# Patient Record
Sex: Male | Born: 1965 | Hispanic: Yes | Marital: Married
Health system: Southern US, Community
[De-identification: ages and names within clinical notes are randomized; demographics above are authoritative.]

---

## 2004-02-14 ENCOUNTER — Encounter: Admission: RE | Admit: 2004-02-14 | Discharge: 2004-02-14 | Payer: Self-pay | Admitting: Specialist

## 2005-09-20 IMAGING — CR DG CHEST 1V
1 series · 1 of 1 positions shown · non-contrast
Comparison: none

CLINICAL DATA: Positive PPD.  Smoking history.
   CHEST - ONE VIEW:
 The heart size and mediastinal contours are within normal limits. The lungs are clear.

[view not recorded]
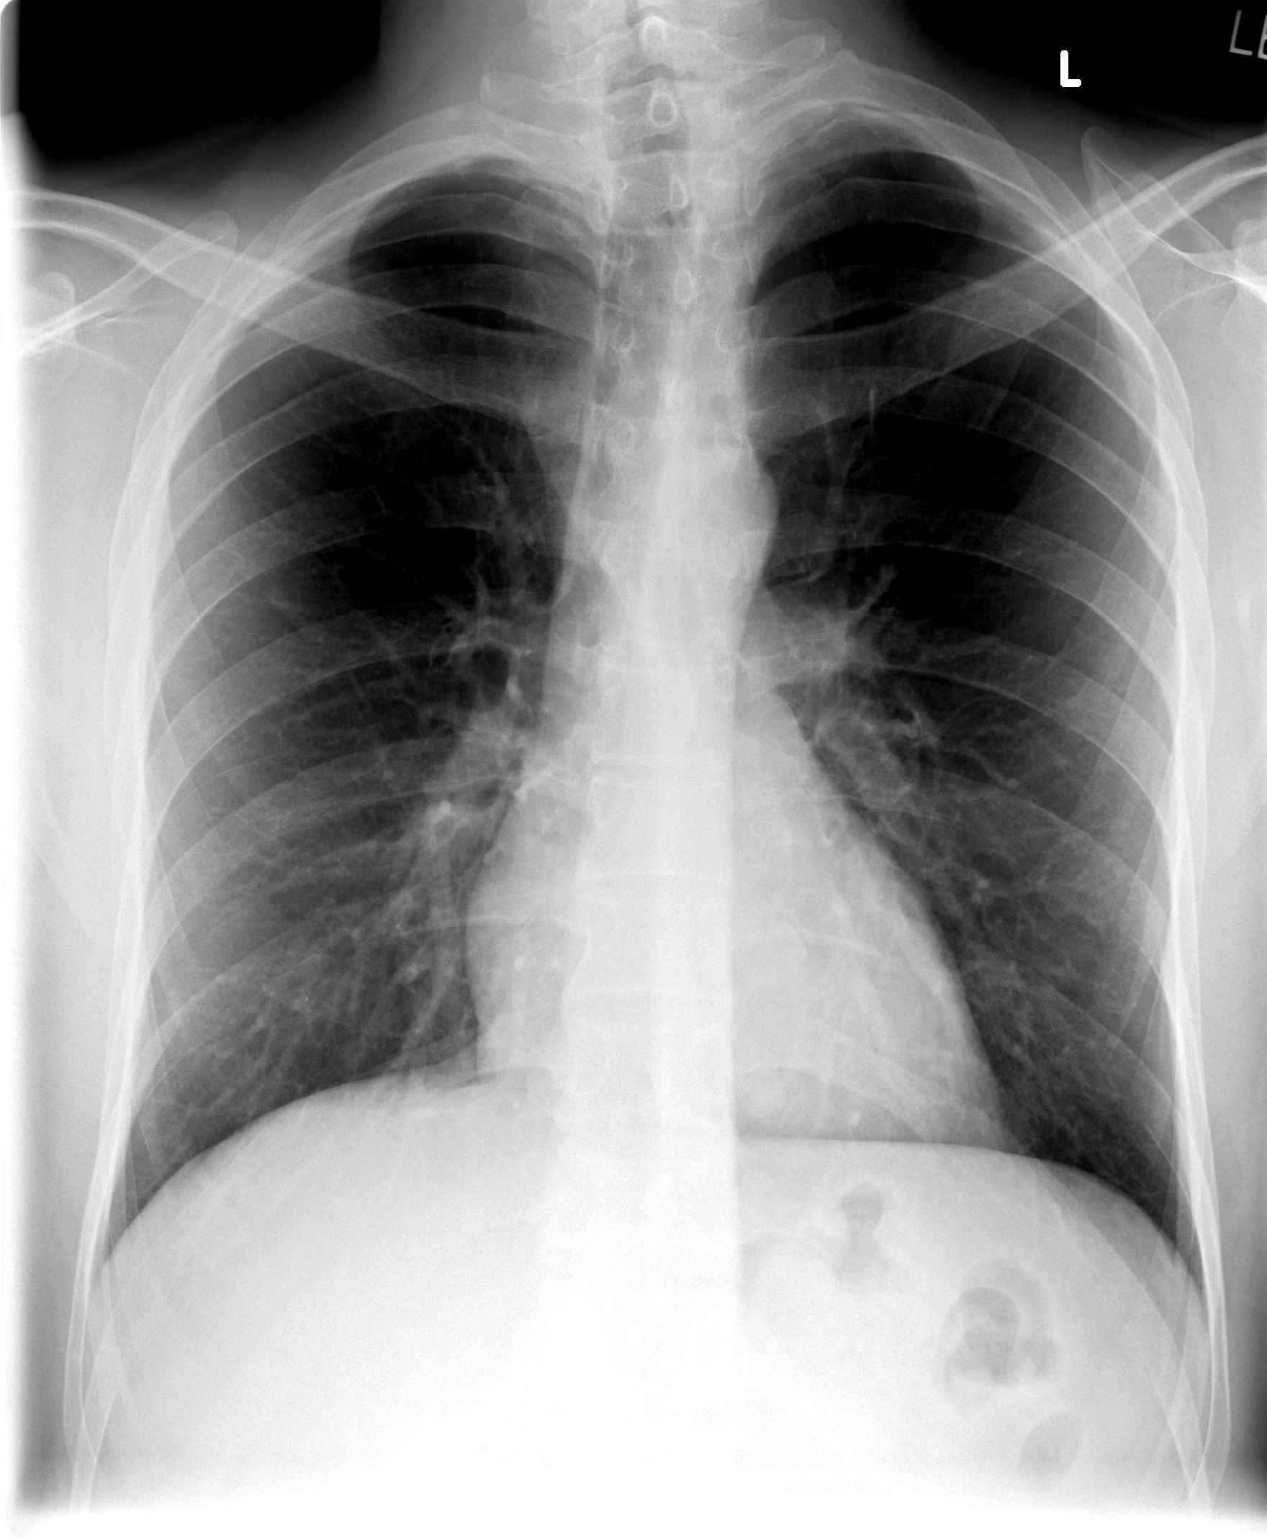

[1 of 1 positions shown; findings below may reference images not displayed]

IMPRESSION: No active cardiopulmonary disease.

## 2007-01-20 ENCOUNTER — Ambulatory Visit: Payer: Self-pay | Admitting: Family Medicine

## 2007-01-21 ENCOUNTER — Telehealth (INDEPENDENT_AMBULATORY_CARE_PROVIDER_SITE_OTHER): Payer: Self-pay | Admitting: *Deleted

## 2007-01-21 ENCOUNTER — Encounter (INDEPENDENT_AMBULATORY_CARE_PROVIDER_SITE_OTHER): Payer: Self-pay | Admitting: Family Medicine

## 2007-01-21 LAB — CONVERTED CEMR LAB
Basophils Absolute: 0 10*3/uL (ref 0.0–0.1)
Basophils Relative: 0.2 % (ref 0.0–1.0)
Eosinophils Absolute: 0.2 10*3/uL (ref 0.0–0.6)
Eosinophils Relative: 2.3 % (ref 0.0–5.0)
HCT: 39.6 % (ref 39.0–52.0)
Hemoglobin: 13.7 g/dL (ref 13.0–17.0)
Lymphocytes Relative: 34.6 % (ref 12.0–46.0)
MCHC: 34.6 g/dL (ref 30.0–36.0)
MCV: 93.5 fL (ref 78.0–100.0)
Monocytes Absolute: 0.6 10*3/uL (ref 0.2–0.7)
Monocytes Relative: 7.5 % (ref 3.0–11.0)
Neutro Abs: 4.6 10*3/uL (ref 1.4–7.7)
Neutrophils Relative %: 55.4 % (ref 43.0–77.0)
Platelets: 276 10*3/uL (ref 150–400)
RBC: 4.23 M/uL (ref 4.22–5.81)
RDW: 13.2 % (ref 11.5–14.6)
WBC: 8.3 10*3/uL (ref 4.5–10.5)

## 2007-02-24 ENCOUNTER — Encounter (INDEPENDENT_AMBULATORY_CARE_PROVIDER_SITE_OTHER): Payer: Self-pay | Admitting: Family Medicine

## 2007-03-01 ENCOUNTER — Encounter (INDEPENDENT_AMBULATORY_CARE_PROVIDER_SITE_OTHER): Payer: Self-pay | Admitting: Family Medicine

## 2007-03-10 ENCOUNTER — Encounter (INDEPENDENT_AMBULATORY_CARE_PROVIDER_SITE_OTHER): Payer: Self-pay | Admitting: Family Medicine

## 2008-09-14 ENCOUNTER — Telehealth (INDEPENDENT_AMBULATORY_CARE_PROVIDER_SITE_OTHER): Payer: Self-pay | Admitting: *Deleted

## 2009-11-09 ENCOUNTER — Ambulatory Visit: Payer: Self-pay | Admitting: Internal Medicine

## 2009-11-09 DIAGNOSIS — N400 Enlarged prostate without lower urinary tract symptoms: Secondary | ICD-10-CM | POA: Insufficient documentation

## 2009-11-09 LAB — CONVERTED CEMR LAB
Bilirubin Urine: NEGATIVE
Blood in Urine, dipstick: NEGATIVE
Glucose, Urine, Semiquant: NEGATIVE
Ketones, urine, test strip: NEGATIVE
Nitrite: NEGATIVE
Protein, U semiquant: NEGATIVE
Specific Gravity, Urine: 1.015
Urobilinogen, UA: 0.2
WBC Urine, dipstick: NEGATIVE
pH: 5

## 2009-11-10 ENCOUNTER — Encounter: Payer: Self-pay | Admitting: Internal Medicine

## 2009-11-10 LAB — CONVERTED CEMR LAB
Bacteria, UA: NONE SEEN
Casts: NONE SEEN /lpf
Crystals: NONE SEEN
RBC / HPF: NONE SEEN (ref <3)
Squamous Epithelial / HPF: NONE SEEN /lpf
WBC, UA: NONE SEEN cells/hpf (ref <3)

## 2009-11-14 ENCOUNTER — Ambulatory Visit: Payer: Self-pay | Admitting: Internal Medicine

## 2009-11-21 ENCOUNTER — Ambulatory Visit: Payer: Self-pay | Admitting: Internal Medicine

## 2009-11-22 ENCOUNTER — Telehealth (INDEPENDENT_AMBULATORY_CARE_PROVIDER_SITE_OTHER): Payer: Self-pay | Admitting: *Deleted

## 2009-11-22 ENCOUNTER — Ambulatory Visit: Payer: Self-pay | Admitting: Internal Medicine

## 2009-11-22 LAB — CONVERTED CEMR LAB
ALT: 18 units/L (ref 0–53)
AST: 16 units/L (ref 0–37)
BUN: 15 mg/dL (ref 6–23)
Basophils Absolute: 0 10*3/uL (ref 0.0–0.1)
Basophils Relative: 0.5 % (ref 0.0–3.0)
CO2: 31 meq/L (ref 19–32)
Calcium: 9.1 mg/dL (ref 8.4–10.5)
Chloride: 109 meq/L (ref 96–112)
Cholesterol: 179 mg/dL (ref 0–200)
Creatinine, Ser: 0.9 mg/dL (ref 0.4–1.5)
Eosinophils Absolute: 0.2 10*3/uL (ref 0.0–0.7)
Eosinophils Relative: 2.8 % (ref 0.0–5.0)
GFR calc non Af Amer: 101.64 mL/min (ref >60)
Glucose, Bld: 90 mg/dL (ref 70–99)
HCT: 43 % (ref 39.0–52.0)
HDL: 45.3 mg/dL (ref >39.00)
Hemoglobin: 14.8 g/dL (ref 13.0–17.0)
LDL Cholesterol: 115 mg/dL — ABNORMAL HIGH (ref 0–99)
Lymphocytes Relative: 33.8 % (ref 12.0–46.0)
Lymphs Abs: 2.4 10*3/uL (ref 0.7–4.0)
MCHC: 34.4 g/dL (ref 30.0–36.0)
MCV: 94.9 fL (ref 78.0–100.0)
Monocytes Absolute: 0.6 10*3/uL (ref 0.1–1.0)
Monocytes Relative: 8.3 % (ref 3.0–12.0)
Neutro Abs: 3.8 10*3/uL (ref 1.4–7.7)
Neutrophils Relative %: 54.6 % (ref 43.0–77.0)
PSA: 0.45 ng/mL (ref 0.10–4.00)
Platelets: 284 10*3/uL (ref 150.0–400.0)
Potassium: 4.9 meq/L (ref 3.5–5.1)
RBC: 4.53 M/uL (ref 4.22–5.81)
RDW: 14.2 % (ref 11.5–14.6)
Sodium: 141 meq/L (ref 135–145)
TSH: 0.73 microintl units/mL (ref 0.35–5.50)
Total CHOL/HDL Ratio: 4
Triglycerides: 93 mg/dL (ref 0.0–149.0)
VLDL: 18.6 mg/dL (ref 0.0–40.0)
WBC: 7 10*3/uL (ref 4.5–10.5)

## 2010-03-08 ENCOUNTER — Ambulatory Visit: Payer: Self-pay | Admitting: Internal Medicine

## 2010-04-07 ENCOUNTER — Telehealth (INDEPENDENT_AMBULATORY_CARE_PROVIDER_SITE_OTHER): Payer: Self-pay | Admitting: *Deleted

## 2010-04-19 ENCOUNTER — Encounter (INDEPENDENT_AMBULATORY_CARE_PROVIDER_SITE_OTHER): Payer: Self-pay | Admitting: *Deleted

## 2010-05-30 NOTE — Progress Notes (Signed)
Summary: Path Report   Phone Note Outgoing Call   Call placed by: Harold Barban,  November 22, 2009 2:07 PM Summary of Call: advise patient:    pathology was benign  lmtcb.Harold Barban  November 22, 2009 2:07 PM  Follow-up for Phone Call        spoke w/ patient wife aware of results..........Marland KitchenDoristine Devoid CMA  November 24, 2009 2:05 PM

## 2010-05-30 NOTE — Assessment & Plan Note (Signed)
Summary: remove stitches.cbs paz pt  Nurse Visit   History of Present Illness: Pt had 2 stitches from mole removal --only 1 stitch seen and removed    Allergies: No Known Drug Allergies

## 2010-05-30 NOTE — Assessment & Plan Note (Signed)
Summary: MOLE REMOVEL---RT LEG////SPH   Vital Signs:  Patient profile:   45 year old male Weight:      218 pounds Pulse rate:   90 / minute Pulse rhythm:   regular BP sitting:   130 / 80  (left arm) Cuff size:   regular  Vitals Entered By: Army Fossa CMA (November 14, 2009 11:29 AM) CC: Mole removal- leg   History of Present Illness: here with his wife for a mole removal  Allergies (verified): No Known Drug Allergies   Impression & Recommendations:  Problem # 1:  procedure #1 indication: new dark mole at the lteral aspect of the R calf in a sterile  fashion and under local anesthesia with 1 cc of 2% lidocaine without, we did a elliptical incision approximately 1.2 cm and remove the skin lesion Pathology sent 2 stitches Will come back in one week for stitch removal  Problem # 2:  procedure #2 the wife also pointed out to a lesion  at the side  of the left knee it is a 3 mm round hard, pale,  skin lesion it has been  there for while but lately has increased in size the patient and the wife requests excision In a sterile fashion and under local anesthesia with 1/2  cc of lidocaine 2% without, we did a superficial cut with scissors, the specimen was sent to pathology. We cauterized the area  Problem # 3:  patient is advised to return to the office if redness or discharge, apply pressure if bleeding  Other Orders: Excise lesion (TAL) 0 - 0.5 cm (11400) Excise lesion (TAL) 0.6 - 1.0 (11401)

## 2010-05-30 NOTE — Assessment & Plan Note (Signed)
Summary: wife says he is very nervous, not sleeping, has stress///sph   Vital Signs:  Patient profile:   45 year old male Weight:      228.25 pounds Pulse rate:   112 / minute Pulse rhythm:   regular BP sitting:   130 / 86  (left arm) Cuff size:   regular  Vitals Entered By: Army Fossa CMA (March 08, 2010 3:30 PM) CC: Pt here c/o increase level of stress Comments Having trouble sleeping @ night x 3 weeks  [facility]   History of Present Illness: several weeks' history of extreme anxiety, inability to sleep, feels extremely "stressed out" Very irritable, having problems with his wife who is very understanding of the situation. he has been unable to figure out what is causing the stress. Things at home and work are okay, and finances are ok as well.  Review of systems Denies fever , weight loss  quit tobacco few  months ago He drinks socially and has noted that his drinking slightly more lately no suicidal ideas recent labs reviewed, normal  Current Medications (verified): 1)  None  Allergies (verified): No Known Drug Allergies  Past History:  Past Medical History: moles-nevi , saw Dr Margo Aye 2008 Anxiety  Past Surgical History: Reviewed history from 01/20/2007 and no changes required. lipoma x 2  Social History: Occupation:[employer] Married 2 children  original from [place] [facility]) Current Smoker-- quit  ~ 9-11 Alcohol use- yes Drug use-no Regular exercise- sometimes  Physical Exam  General:  alert, well-developed, and well-nourished.   Psych:  Oriented X3 and good eye contact. obviously anxious and depress, tearful at times    Impression & Recommendations:  Problem # 1:  ANXIETY (ICD-300.00) new onset of anxiety no clear etiology we discussed about the different options of treatment: Medications, counseling, and psych  referral We did extensive counseling today for > 25  minutes We agreed to start citalopram, side effects including  suicide ideas discussed He doesn't like anything that could be habit-forming, we'll avoid  benzos for  now. He will return in 3 weeks  He has consider short-term disability, if that is the case he knows to let me know and I will refer him to a psychiatrist  His updated medication list for this problem includes:    Citalopram Hydrobromide 20 Mg Tabs (Citalopram hydrobromide) .Marland Kitchen... 1/2 a day x 5 days, then 1 a day  Problem # 2:  F2F > 25 min > 50% of the time counseling and  Complete Medication List: 1)  Citalopram Hydrobromide 20 Mg Tabs (Citalopram hydrobromide) .... 1/2 a day x 5 days, then 1 a day  Patient Instructions: 1)  Please schedule a follow-up appointment in 3  weeks.  Prescriptions: CITALOPRAM HYDROBROMIDE 20 MG TABS (CITALOPRAM HYDROBROMIDE) 1/2 a day x 5 days, then 1 a day  #30 x 1   Entered and Authorized by:   Elita Quick E. Paz MD   Signed by:   Nolon Rod. Paz MD on 03/08/2010   Method used:   Print then Give to Patient   RxID:   [id number]    Orders Added: 1)  Est. Patient Level IV [14782]

## 2010-05-30 NOTE — Assessment & Plan Note (Signed)
Summary: CPX  //kn   Vital Signs:  Patient profile:   45 year old male Height:      78.5 inches Weight:      219.38 pounds BMI:     25.12 Pulse rate:   88 / minute Pulse rhythm:   regular BP sitting:   128 / 80  (left arm) Cuff size:   regular  Vitals Entered By: Army Fossa CMA (November 09, 2009 3:08 PM) CC: To establish. Comments Would like Prostate Checked. Has a black spot on Right leg.    History of Present Illness: CPX 1-2 year history of sporadic difficulty urinating in the morning along with a feeling of not been able to empty  the bladder occ. l frequency no nocturnal symptoms history of gross  hematuria 3 years ago, sulfa doctor, was told he had infection sporadic problems with erections  New mole in the right calf for a few months   Allergies (verified): No Known Drug Allergies  Past History:  Past Medical History: moles-nevi , saw Dr Margo Aye 2008  Past Surgical History: Reviewed history from 01/20/2007 and no changes required. lipoma x 2  Family History:  Hypertension-- M DM-- no MI--no Colon ca-- no prostate ca--no  Social History: Occupation:[employer] Married 2 children  original from [place] [facility]) Current Smoker-- 3/4 ppd  Alcohol use- yes Drug use-no Regular exercise- sometimes  Review of Systems General:  Denies fever and weight loss; occasionally fatige, works long hours . CV:  Denies chest pain or discomfort and swelling of feet. Resp:  Denies wheezing; having a cold , ++ cough x few days , ++ green sputum w/ very small strick of blood feels a lot better now  . GI:  Denies bloody stools, constipation, nausea, and vomiting; occasionally diarrhea .  Physical Exam  General:  alert, well-developed, and well-nourished.   Neck:  no masses, no thyromegaly, and no cervical lymphadenopathy.   Lungs:  normal respiratory effort, no intercostal retractions, and no accessory muscle use.  few rhonchi with cough Heart:  normal rate,  regular rhythm, and no murmur.   Abdomen:  soft, non-tender, normal bowel sounds, no distention, no masses, no guarding, no rigidity, and no inguinal hernia.   Rectal:  No external abnormalities noted. Normal sphincter tone. No rectal masses or tenderness. Genitalia:  circumcised, no hydrocele, no varicocele, no scrotal masses, no testicular masses or atrophy, no cutaneous lesions, and no urethral discharge.   Prostate:  slightly enlarged prostatic gland, not tender, ?3 mm nodule on the right Extremities:  no edema Skin:  multiple moles He points to one at the site of the right calf, 3 mm, slightly elevated and hyperpigmented Psych:  good eye contact, not anxious appearing, and not depressed appearing.     Impression & Recommendations:  Problem # 1:  ROUTINE GENERAL MEDICAL EXAM@HEALTH  CARE FACL (ICD-V70.0) Td 06 Diet and exercise discussed Counseled  about tobacco, needs to see the dentist  Labs  Problem # 2:  BRONCHITIS (ICD-490) recent episode consistent with bronchitis, some hemoptysis. Patient is a smoker. Will check a x-ray, if it is negative and the patient remained asymptomatic no further workup  Orders: T-2 View CXR (71020TC)  Problem # 3:  HYPERTROPHY PROSTATE W/O UR OBST & OTH LUTS (ICD-600.00) symptoms consistent with mild BPH DRE show a question of a prostate nodule UA PSA Urology referral  Orders: UA Dipstick w/o Micro (automated)  (81003) T-Urine Microscopic (95621-30865) T-Culture, Urine (78469-62952)  Problem # 4:  NEOP, BNG, SKIN  NOS (ICD-216.9) new mole at the right leg. Recommend excision.  patient was scheduled  Patient Instructions: 1)  get your chest x-ray 2)  came  back fasting for labs: 3)  CBC BMP AST ALT TSH FLP ---dx v70 4)  PSA---dx BPH 5)  Please schedule a follow-up appointment in 3 months .    Risk Factors:     Preventive Care Screening  Last Tetanus Booster:    Date:  04/30/2004    Results:  Historical   Laboratory  Results   Urine Tests    Routine Urinalysis   Color: yellow Appearance: Clear Glucose: negative   (Normal Range: Negative) Bilirubin: negative   (Normal Range: Negative) Ketone: negative   (Normal Range: Negative) Spec. Gravity: 1.015   (Normal Range: 1.003-1.035) Blood: negative   (Normal Range: Negative) pH: 5.0   (Normal Range: 5.0-8.0) Protein: negative   (Normal Range: Negative) Urobilinogen: 0.2   (Normal Range: 0-1) Nitrite: negative   (Normal Range: Negative) Leukocyte Esterace: negative   (Normal Range: Negative)    Comments: Army Fossa CMA  November 09, 2009 3:51 PM

## 2010-06-01 NOTE — Progress Notes (Signed)
Summary: needs followup--gave message to wife (12/16)--mailed letter12/21  Phone Note Outgoing Call   Summary of Call: patient is due for a followup, if he is interested, please arrange Jose E. Paz MD  April 07, 2010 11:45 AM   Follow-up for Phone Call        called and spoke to wife---she says patient will have to call us and make his appt--she will give him the message.Jerolyn Shin  April 14, 2010 10:46 AM

## 2010-06-01 NOTE — Letter (Signed)
Summary: Primary Care Appointment Letter  [facility]  [address] [place], [place] [address]   Phone: [phone]  Fax: [phone]    04/19/2010 MRN: [id number]  Jerry Yoder [address] [place], [place]  [address]  Dear Mr. BUNCH,   Your Primary Care Physician Ravenna E. Paz MD has indicated that:    _XXXXX__it is time to schedule an appointment for a followup.    _______you missed your appointment on______ and need to call and          reschedule.    _______you need to have lab work done.    _______you need to schedule an appointment discuss lab or test results.    _______you need to call to reschedule your appointment that is                       scheduled on _________.     Please call our office as soon as possible. Our phone number is [phone].   Please press option 1. Our office is open 8a-12noon and 1p-5p, Monday through Friday.     Thank you,    [facility] Primary [facility] Scheduler

## 2010-06-15 ENCOUNTER — Encounter: Payer: Self-pay | Admitting: Internal Medicine

## 2010-06-15 ENCOUNTER — Ambulatory Visit (INDEPENDENT_AMBULATORY_CARE_PROVIDER_SITE_OTHER): Payer: Self-pay | Admitting: Internal Medicine

## 2010-06-15 ENCOUNTER — Other Ambulatory Visit: Payer: Self-pay | Admitting: Internal Medicine

## 2010-06-15 DIAGNOSIS — K219 Gastro-esophageal reflux disease without esophagitis: Secondary | ICD-10-CM

## 2010-06-15 DIAGNOSIS — N476 Balanoposthitis: Secondary | ICD-10-CM

## 2010-06-15 LAB — CBC WITH DIFFERENTIAL/PLATELET
Basophils Absolute: 0 10*3/uL (ref 0.0–0.1)
Basophils Relative: 0.5 % (ref 0.0–3.0)
Eosinophils Absolute: 0.2 10*3/uL (ref 0.0–0.7)
Eosinophils Relative: 3.5 % (ref 0.0–5.0)
HCT: 40.4 % (ref 39.0–52.0)
Hemoglobin: 13.6 g/dL (ref 13.0–17.0)
Lymphocytes Relative: 36.9 % (ref 12.0–46.0)
Lymphs Abs: 2.1 10*3/uL (ref 0.7–4.0)
MCHC: 33.5 g/dL (ref 30.0–36.0)
MCV: 95.6 fl (ref 78.0–100.0)
Monocytes Absolute: 0.6 10*3/uL (ref 0.1–1.0)
Monocytes Relative: 10.5 % (ref 3.0–12.0)
Neutro Abs: 2.7 10*3/uL (ref 1.4–7.7)
Neutrophils Relative %: 48.6 % (ref 43.0–77.0)
Platelets: 253 10*3/uL (ref 150.0–400.0)
RBC: 4.23 Mil/uL (ref 4.22–5.81)
RDW: 13.7 % (ref 11.5–14.6)
WBC: 5.6 10*3/uL (ref 4.5–10.5)

## 2010-06-15 LAB — BASIC METABOLIC PANEL
BUN: 14 mg/dL (ref 6–23)
CO2: 31 mEq/L (ref 19–32)
Calcium: 10 mg/dL (ref 8.4–10.5)
Chloride: 104 mEq/L (ref 96–112)
Creatinine, Ser: 1 mg/dL (ref 0.4–1.5)
GFR: 83.26 mL/min (ref >60.00)
Glucose, Bld: 102 mg/dL — ABNORMAL HIGH (ref 70–99)
Potassium: 4.4 mEq/L (ref 3.5–5.1)
Sodium: 141 mEq/L (ref 135–145)

## 2010-06-16 ENCOUNTER — Telehealth: Payer: Self-pay | Admitting: Internal Medicine

## 2010-06-21 NOTE — Assessment & Plan Note (Signed)
Summary: burning in chest/burping alot/kn   Vital Signs:  Patient profile:   45 year old male Weight:      236.25 pounds Pulse rate:   82 / minute Pulse rhythm:   regular BP sitting:   150 / 80  (left arm) Cuff size:   regular  Vitals Entered By: Army Fossa CMA (June 15, 2010 10:37 AM) CC: c/o burning in throat, lots of burping Comments x 2-3 months [facility] [employer]    History of Present Illness: for the last 2 months, his previously  mild   GERD symptoms have been worse. He is having heartburn more frequently and intensely, usually associated with burping. The symptoms may last for a few hours.  Also reports glans and foreskin irritation, mild from time to time. 2 days ago, he developed intense inflammation, swelling, burning at the glans and foreskin after sexual intercourse with his wife. They do   use occasionally a  lubricant gel.  ROS Denies dysphasia or odynophagia No weight loss, no blood in the stools, no abdominal pain in the last year had a couple of episodes of diarrhea (but they were related to visiting [place]) His diet has not changed He drinks alcohol socially, he apparently is drinking slt more than usual. Does not use Motrin or similar medicines  also denies high-risk sexual behavior No gross hematuria or penile discharge  he was seen here months ago with severe anxiety, symptoms resolved after he took a vacation  He only took citalopram for a few days  Current Medications (verified): 1)  Citalopram Hydrobromide 20 Mg Tabs (Citalopram Hydrobromide) .... 1/2 A Day X 5 Days, Then 1 A Day  Allergies (verified): No Known Drug Allergies  Past History:  Past Medical History: moles-nevi , saw Dr Margo Aye 2008 Anxiety GERD  Social History: Reviewed history from 03/08/2010 and no changes required. Occupation:[employer] Married 2 children  original from [place] [facility]) Current Smoker-- quit  ~ 9-11 Alcohol use- yes Drug  use-no Regular exercise- sometimes  Physical Exam  General:  alert and well-developed.   Neck:  no masses and no cervical or supraclavicular  lymphadenopathy.   Lungs:  normal respiratory effort, no intercostal retractions, and no accessory muscle use.  few rhonchi with cough Heart:  normal rate, regular rhythm, and no murmur.   Abdomen:  soft, non-tender, normal bowel sounds, no distention, no masses, no guarding, no rigidity, and no inguinal hernia.   Genitalia:  uncircumcised, no hydrocele, no varicocele, no scrotal masses, no testicular masses or atrophy, and no urethral discharge.  foreskin and glans slightly red. No ulcers or blisters   Impression & Recommendations:  Problem # 1:  GERD (ICD-530.81)  mild on and off symptoms of GERD increased for the last 2 months, no red flag symptoms. Plan: GERD prevention information provided PPIs, labs. Further eval if he has anemia or if symptoms do not respond to PPIs  His updated medication list for this problem includes:    Nexium 40 Mg Cpdr (Esomeprazole magnesium) ..... 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 He and his wife use sometimes lubricants. Allergic reaction? will call if this is an ongoing problem check BMP (high sugar?)  he wonders about a circumcision, we certainly can refer to urology if symptoms persist  Problem #  3:  ANXIETY (ICD-300.00) resolved  The following medications were removed from the medication list:    Citalopram Hydrobromide 20 Mg Tabs (Citalopram hydrobromide) .Marland Kitchen... 1/2 a day x 5 days, then 1 a day  Complete Medication List: 1)  Nexium 40 Mg Cpdr (Esomeprazole magnesium) .... One by mouth before breakfast for 6 weeks 2)  Lotrisone 1-0.05  % Crea (Clotrimazole-betamethasone) .... Apply twice a dayfor 10 days  Patient Instructions: 1)  please call if you continue to have problems Prescriptions: LOTRISONE 1-0.05 % CREA (CLOTRIMAZOLE-BETAMETHASONE) apply twice a dayfor 10 days  #1 x 0   Entered and Authorized by:   Nolon Rod. Reighlynn Swiney MD   Signed by:   Nolon Rod. Olander Friedl MD on 06/15/2010   Method used:   Print then Give to Patient   RxID:   [id number] NEXIUM 40 MG CPDR (ESOMEPRAZOLE MAGNESIUM) one by mouth before breakfast for 6 weeks  #30 x 3   Entered and Authorized by:   Nolon Rod. Rajah Tagliaferro MD   Signed by:   Nolon Rod. Palma Buster MD on 06/15/2010   Method used:   Print then Give to Patient   RxID:   [id number]    Orders Added: 1)  Venipuncture [51884] 2)  TLB-BMP (Basic Metabolic Panel-BMET) [80048-METABOL] 3)  TLB-CBC Platelet - w/Differential [85025-CBCD] 4)  Specimen Handling [99000] 5)  Est. Patient Level IV [16606]

## 2010-06-26 ENCOUNTER — Ambulatory Visit (INDEPENDENT_AMBULATORY_CARE_PROVIDER_SITE_OTHER): Payer: Managed Care, Other (non HMO) | Admitting: Internal Medicine

## 2010-06-26 ENCOUNTER — Encounter: Payer: Self-pay | Admitting: Internal Medicine

## 2010-06-26 DIAGNOSIS — N476 Balanoposthitis: Secondary | ICD-10-CM

## 2010-06-26 LAB — CONVERTED CEMR LAB
Bilirubin Urine: NEGATIVE
Blood in Urine, dipstick: NEGATIVE
Glucose, Urine, Semiquant: NEGATIVE
Ketones, urine, test strip: NEGATIVE
Nitrite: NEGATIVE
Protein, U semiquant: NEGATIVE
Specific Gravity, Urine: 1.015
Urobilinogen, UA: 0.2
WBC Urine, dipstick: NEGATIVE
pH: 6

## 2010-06-27 NOTE — Progress Notes (Addendum)
Summary: PA--Nexium  Phone Note Refill Request Message from:  Fax from Pharmacy on June 16, 2010 9:14 AM  Refills Requested: Medication #1:  NEXIUM 40 MG CPDR one by mouth before breakfast for 6 weeks [employer] - priot Hartman - [id number] - id [id number]   Initial call taken by: Dannielle Karvonen,  June 16, 2010 9:15 AM  Follow-up for Phone Call        Forms requested, PA [phone]. Lucious Groves CMA  June 16, 2010 9:19 AM   Forms received and pending MD completion. Lucious Groves CMA  June 16, 2010 11:18 AM   Has this been completed? Lucious Groves CMA  June 20, 2010 9:17 AM   Additional Follow-up for Phone Call Additional follow up Details #1::        done [facility] E. Frederic Tones MD  June 21, 2010 9:06 AM   Foms completed and faxed, will await insurance company reply. Lucious Groves CMA  June 21, 2010 9:13 AM      Appended Document: PA--Nexium I do not see any approved or denial letters, prescription was approved x1 year.

## 2010-07-06 NOTE — Assessment & Plan Note (Signed)
Summary: ACUTE   Vital Signs:  Patient profile:   45 year old male Weight:      240.13 pounds Temp:     98.6 degrees F oral Pulse rate:   65 / minute Pulse rhythm:   regular BP sitting:   130 / 84  (left arm) Cuff size:   regular  Vitals Entered By: Army Fossa CMA (June 26, 2010 3:46 PM) CC: Pt here to f/u- infection not much better Comments [facility]    History of Present Illness:  he was seen a few days ago with balanitis, was prescribed clotrimazole which he used  as indicated. He has improved but "not 100%"  he still has some discomfort  @  the  foreskin   ROS No dysuria, gross hematuria No penile discharge No rash or blisters that he can tell   Current Medications (verified): 1)  Nexium 40 Mg Cpdr (Esomeprazole Magnesium) .... One By Mouth Before Breakfast For 6 Weeks  Allergies (verified): No Known Drug Allergies  Past History:  Past Medical History: Reviewed history from 06/15/2010 and no changes required. moles-nevi , saw Dr Margo Aye 2008 Anxiety GERD  Past Surgical History: Reviewed history from 01/20/2007 and no changes required. lipoma x 2  Social History: Reviewed history from 03/08/2010 and no changes required. Occupation:[employer] Married 2 children  original from [place] [facility]) Current Smoker-- quit  ~ 9-11 Alcohol use- yes Drug use-no Regular exercise- sometimes  Physical Exam  General:  alert and well-developed.   Genitalia:  uncircumcised, no hydrocele, no varicocele, no scrotal masses, no testicular masses or atrophy, and no urethral discharge.  foreskin and glans  essentially normal, no swelling ,  no rash except for a minor redness in the foreskin and base of glans    Impression & Recommendations:  Problem # 1:  BALANITIS (ICD-607.1)   foreskin seems better compared to the last time however the patient is not completely satisfied with his treatment. At the same time he likes to have a circumcision.   plan:  Re-treat  with ketoconazole and hydrocortisone  referral to urology for consideration of circumcision  Orders: UA Dipstick w/o Micro (manual) (54098) Urology Referral (Urology)  Complete Medication List: 1)  Nexium 40 Mg Cpdr (Esomeprazole magnesium) .... One by mouth before breakfast for 6 weeks 2)  Ketoconazole 2 % Crea (Ketoconazole) .... Apply two times a day x 1 week 3)  Hydrocortisone 2.5 % Crea (Hydrocortisone) .... Apply two times a day x 1 week Prescriptions: HYDROCORTISONE 2.5 % CREA (HYDROCORTISONE) apply two times a day x 1 week  #1 x 0   Entered and Authorized by:   Nolon Rod. Julaine Zimny MD   Signed by:   Nolon Rod. Ellyana Crigler MD on 06/26/2010   Method used:   Print then Give to Patient   RxID:   [id number] KETOCONAZOLE 2 % CREA (KETOCONAZOLE) apply two times a day x 1 week  #1 x 0   Entered and Authorized by:   Nolon Rod. Hazel Wrinkle MD   Signed by:   Nolon Rod. Parv Manthey MD on 06/26/2010   Method used:   Print then Give to Patient   RxID:   [id number]    Orders Added: 1)  UA Dipstick w/o Micro (manual) [81002] 2)  Est. Patient Level III [24401] 3)  Urology Referral [Urology]    Laboratory Results   Urine Tests    Routine Urinalysis   Color: yellow Appearance: Clear Glucose: negative   (Normal Range: Negative) Bilirubin: negative   (  Normal Range: Negative) Ketone: negative   (Normal Range: Negative) Spec. Gravity: 1.015   (Normal Range: 1.003-1.035) Blood: negative   (Normal Range: Negative) pH: 6.0   (Normal Range: 5.0-8.0) Protein: negative   (Normal Range: Negative) Urobilinogen: 0.2   (Normal Range: 0-1) Nitrite: negative   (Normal Range: Negative) Leukocyte Esterace: negative   (Normal Range: Negative)    Comments: Army Fossa CMA  June 26, 2010 4:39 PM

## 2010-07-14 ENCOUNTER — Telehealth: Payer: Self-pay | Admitting: Internal Medicine

## 2010-07-18 NOTE — Progress Notes (Signed)
Summary: Nexium RX  Phone Note Outgoing Call   Summary of Call: I spoke with insurance company about Nexium prior authorization, it has been approved x1 year and she also notified me that the pharmacy has not ran the prescription since Feb. She suggested that I send script with notation that it is approved to patient pharmacy. Lucious Groves CMA  July 14, 2010 11:28 AM   Patient spouse notified and prescription sent. Lucious Groves CMA  July 14, 2010 11:39 AM     Prescriptions: NEXIUM 40 MG CPDR (ESOMEPRAZOLE MAGNESIUM) one by mouth before breakfast for 6 weeks  #30 x 3   Entered by:   Lucious Groves CMA   Authorized by:   Nolon Rod. Paz MD   Signed by:   Lucious Groves CMA on 07/14/2010   Method used:   Electronically to        [facility] (retail)       [address]       [place], [place]  [address]       Ph: [phone]       Fax: [phone]   RxID:   [id number]

## 2010-08-11 ENCOUNTER — Ambulatory Visit (HOSPITAL_BASED_OUTPATIENT_CLINIC_OR_DEPARTMENT_OTHER)
Admission: RE | Admit: 2010-08-11 | Discharge: 2010-08-11 | Disposition: A | Discharge status: Home / Self Care | Payer: Managed Care, Other (non HMO) | Source: Ambulatory Visit | Attending: Urology | Admitting: Urology

## 2010-08-11 DIAGNOSIS — Z01812 Encounter for preprocedural laboratory examination: Secondary | ICD-10-CM | POA: Insufficient documentation

## 2010-08-11 DIAGNOSIS — Z01810 Encounter for preprocedural cardiovascular examination: Secondary | ICD-10-CM | POA: Insufficient documentation

## 2010-08-11 DIAGNOSIS — N478 Other disorders of prepuce: Secondary | ICD-10-CM | POA: Insufficient documentation

## 2010-08-11 DIAGNOSIS — Z87891 Personal history of nicotine dependence: Secondary | ICD-10-CM | POA: Insufficient documentation

## 2010-08-11 DIAGNOSIS — N471 Phimosis: Secondary | ICD-10-CM | POA: Insufficient documentation

## 2010-08-11 LAB — POCT HEMOGLOBIN-HEMACUE: Hemoglobin: 14.3 g/dL (ref 13.0–17.0)

## 2010-08-15 NOTE — Op Note (Signed)
  [name], Jerry Yoder            ACCOUNT NO.:  [id number]  MEDICAL RECORD NO.:  [id number]           PATIENT TYPE:  LOCATION:                                 FACILITY:  PHYSICIAN:  Kristeena Meineke I. Patsi Sears, M.D. DATE OF BIRTH:  DATE OF PROCEDURE:  08/11/2010 DATE OF DISCHARGE:                              OPERATIVE REPORT   PREOPERATIVE DIAGNOSIS:  Chronic phimosis.  POSTOPERATIVE DIAGNOSIS:  Chronic phimosis.  OPERATION:  Circumcision.  SURGEON:  Dequante Tremaine I. Patsi Sears, M.D.  ANESTHESIA:  General LMA.  PREPARATION:  After appropriate preanesthesia, the patient was brought to the operating room, placed on the operating room table in dorsal supine position where general LMA anesthesia was induced.  He remained in this position where the penis was prepped with Betadine solution and draped in usual fashion.  REVIEW OF HISTORY:  This 45 year old male has history of recurrent balanitis who has now elected to pursue elective circumcision.  The patient is properly informed, and after general LMA anesthesia was introduced, the penis was prepped with Betadine solution and draped in usual fashion.  DESCRIPTION OF PROCEDURE:  Two circumcising blue marks were made in the pericoronal and periglandular areas, and these marks were then incised, and the phimotic foreskin was removed.  Bleeding was controlled with the electrosurgical unit.  Following this, four quadrants were then created using 4-0 Monocryl suture and each quadrant was closed with interrupted 4-0 Monocryl suture.  Following this, dressing was applied by using Dermabond.  The patient tolerated the procedure well.  He was awakened and taken to recovery room in good condition.     [name] I. Patsi Sears, M.D.     SIT/MEDQ  D:  08/11/2010  T:  08/11/2010  Job:  [id number]  Electronically Signed by Jethro Bolus M.D. on 08/15/2010 03:05:42 PM

## 2011-06-29 IMAGING — CR DG CHEST 2V
2 series · 2 of 2 positions shown · non-contrast
Comparison: 02/14/2004

CLINICAL DATA: Cough.  Hemoptysis.  Chronic bronchitis.  Smoker.

CHEST - 2 VIEW

[view not recorded (1 of 2)]
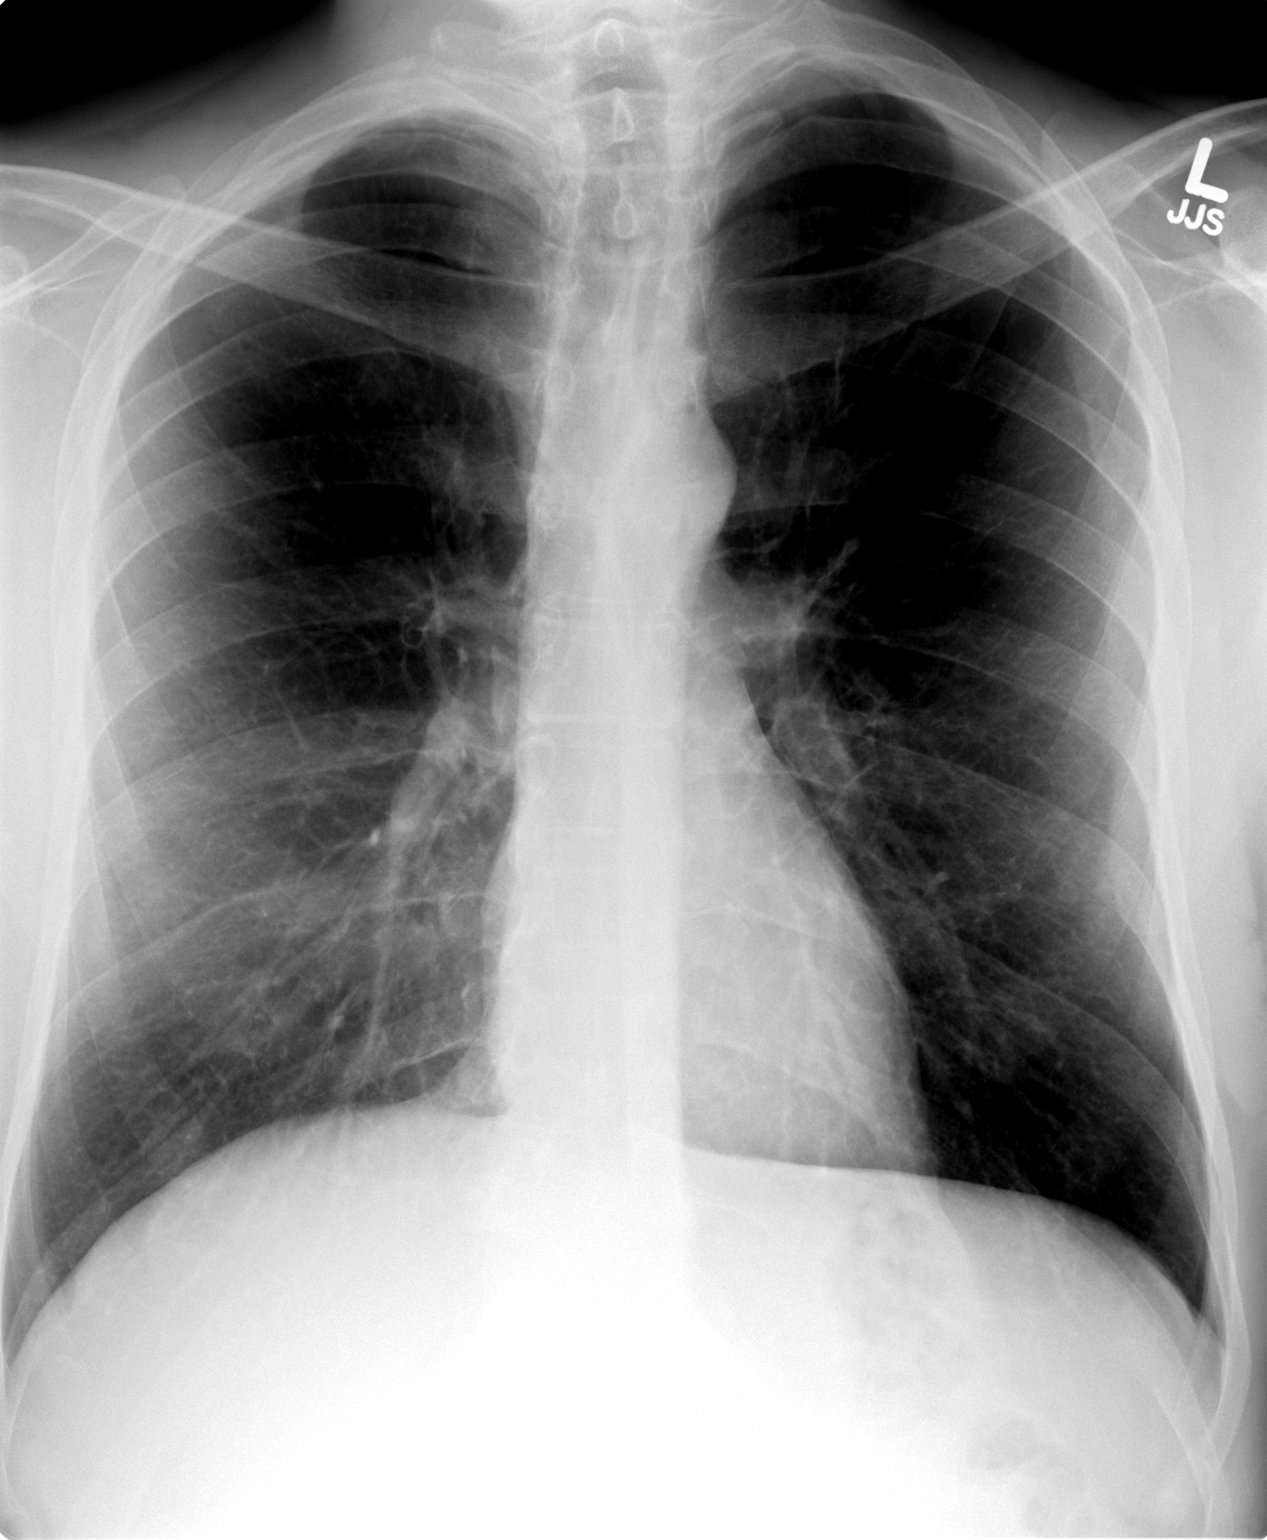

[view not recorded (2 of 2)]
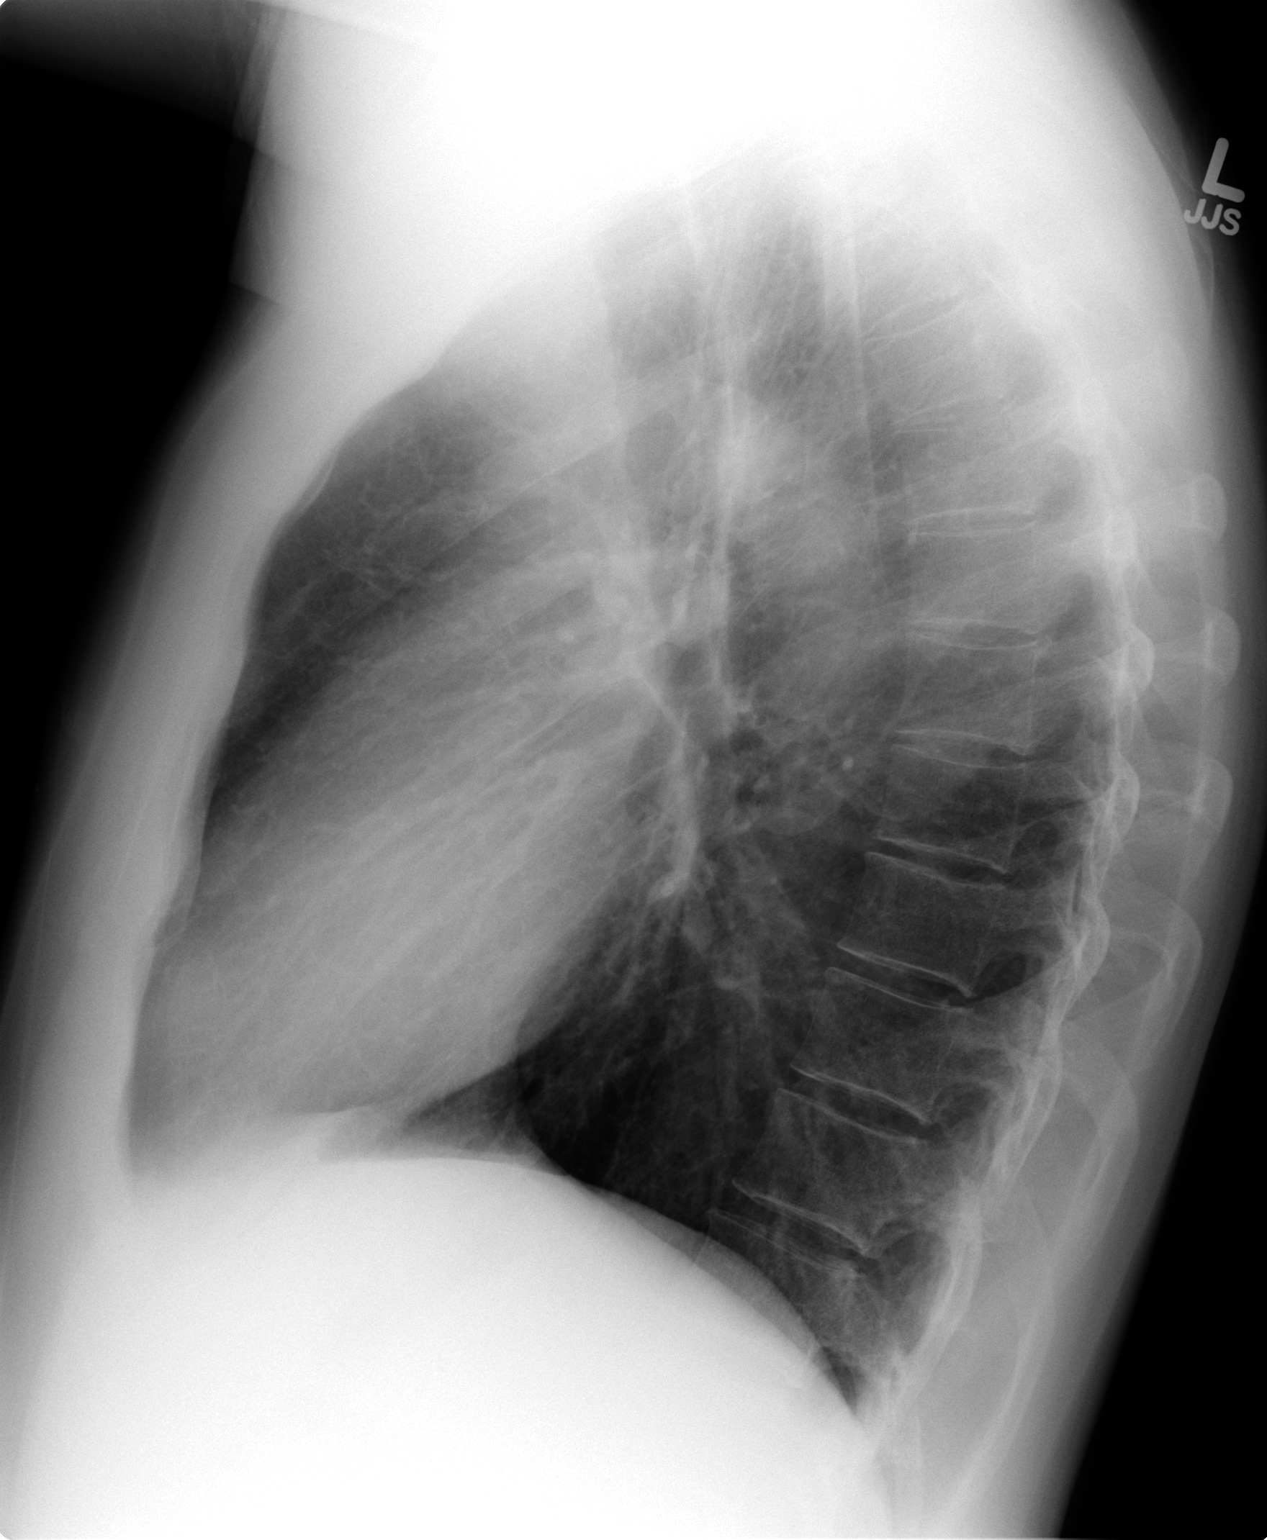

[2 of 2 positions shown; findings below may reference images not displayed]

FINDINGS: Pulmonary hyperinflation again seen, suspicious for COPD.
No evidence of pulmonary infiltrate or edema.  No evidence of
pleural effusion.  No mass or adenopathy identified.  Heart size is
normal.
IMPRESSION: Stable pulmonary hyperinflation, suspicious for COPD.  No active
disease.

## 2014-02-19 ENCOUNTER — Ambulatory Visit (INDEPENDENT_AMBULATORY_CARE_PROVIDER_SITE_OTHER): Payer: Self-pay | Admitting: Surgery

## 2014-02-19 NOTE — H&P (Signed)
  History of Present Illness Jerry Yoder(Derenda Giddings K. Randen Kauth MD; 02/19/2014 9:56 AM) Patient words: Lipoma on UPPER LEFT back.  The patient is a 48 year old male who presents with a complaint of Mass. This is a 48 yo male in good health who presents with an enlargin mass on the left side of his back near his shoulder. This has been present for about two years and seems to be enlarging. It is mildly uncomfortable. 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 He presents now to discuss excision. Other Problems [name](Rachel McMillen; 02/19/2014 9:31 AM) Gastroesophageal Reflux Disease High blood pressure Migraine Headache  Allergies [name](Rachel McMillen; 02/19/2014 9:32 AM) No Known Drug Allergies 02/19/2014  Medication History [name](Cindy Alma CenterSmithey, LPN; 11/91/478210/23/2015 9:36 AM) Losartan Potassium-HCTZ (Oral) Specific dose unknown - Active.  Family History [name](Rachel McMillen; 02/19/2014 9:31 AM) Arthritis Mother. Breast Cancer Family Members In General. Depression Mother. Hypertension Mother.     Review of Systems [name](Rachel ChiloMcMillen; 02/19/2014 9:31 AM) General Present- Night Sweats. Not Present- Appetite Loss, Chills, Fatigue, Fever, Weight Gain and Weight Loss. HEENT Present- Seasonal Allergies. Not Present- Earache, Hearing Loss, Hoarseness, Nose Bleed, Oral Ulcers, Ringing in the Ears, Sinus Pain, Sore Throat, Visual Disturbances, Wears glasses/contact lenses and Yellow Eyes. Cardiovascular Present- Palpitations. Not Present- Chest Pain, Difficulty Breathing Lying Down, Leg Cramps, Rapid Heart Rate, Shortness of Breath and Swelling of Extremities. Male Genitourinary Present- Frequency. Not Present- Blood in Urine, Change in Urinary Stream, Impotence, Nocturia, Painful Urination, Urgency and Urine Leakage. Neurological Present- Headaches and Weakness. Not Present- Decreased Memory, Fainting, Numbness, Seizures, Tingling, Tremor and Trouble walking.  Vitals [name](Rachel JacksonvilleMcMillen;  02/19/2014 9:34 AM) 02/19/2014 9:33 AM Weight: 231.25 lb Height: 77in Body Surface Area: 2.39 m Body Mass Index: 27.42 kg/m Temp.: 98.58F(Oral)  Pulse: 82 (Regular)  Resp.: 16 (Unlabored)  BP: 146/104 (Sitting, Left Arm, Standard)     Physical Exam [name](Sherrod Toothman K. Judy Goodenow MD; 02/19/2014 9:57 AM)  The physical exam findings are as follows: Note:WDWN in NAD HEENT: EOMI, sclera anicteric Neck: No masses, no thyromegaly Lungs: CTA bilaterally; normal respiratory effort CV: Regular rate and rhythm; no murmurs Abd: +bowel sounds, soft, non-tender, no masses Ext: Well-perfused; no edema Back: healed midline scar from previous lipoma excision; 3 cm subcutaneous on posterior left shoulder - no inflammation or infection. Skin: Warm, dry; no sign of jaundice    Assessment & Plan Jerry Yoder(Wyllow Seigler K. 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 Potential risks, benefits, alternative treatments, and expected outcomes have been explained. All of the patient's questions at this time have been answered. The likelihood of reaching the patient's treatment goal is good. The patient understand the proposed surgical procedure and wishes to proceed.   Jerry ArmsMatthew K. Corliss Skainssuei, MD, [facility]  General/ Trauma Surgery  02/19/2014 9:57 AM

## 2023-11-21 ENCOUNTER — Other Ambulatory Visit: Payer: Self-pay

## 2023-11-21 DIAGNOSIS — R1031 Right lower quadrant pain: Secondary | ICD-10-CM

## 2023-11-21 DIAGNOSIS — N50819 Testicular pain, unspecified: Secondary | ICD-10-CM

## 2023-11-25 ENCOUNTER — Ambulatory Visit
Admission: RE | Admit: 2023-11-25 | Discharge: 2023-11-25 | Disposition: A | Discharge status: Home / Self Care | Source: Ambulatory Visit

## 2023-11-25 DIAGNOSIS — N50819 Testicular pain, unspecified: Secondary | ICD-10-CM

## 2023-11-25 DIAGNOSIS — R1031 Right lower quadrant pain: Secondary | ICD-10-CM
